# Patient Record
Sex: Female | Born: 1955
Health system: Southern US, Community
[De-identification: ages and names within clinical notes are randomized; demographics above are authoritative.]

## PROBLEM LIST (undated history)

## (undated) DIAGNOSIS — I10 Essential (primary) hypertension: Secondary | ICD-10-CM

## (undated) DIAGNOSIS — E039 Hypothyroidism, unspecified: Secondary | ICD-10-CM

## (undated) DIAGNOSIS — E785 Hyperlipidemia, unspecified: Secondary | ICD-10-CM

## (undated) HISTORY — DX: Hypothyroidism, unspecified: E03.9

## (undated) HISTORY — DX: Hyperlipidemia, unspecified: E78.5

## (undated) HISTORY — DX: Essential (primary) hypertension: I10

---

## 2006-08-07 ENCOUNTER — Observation Stay (HOSPITAL_COMMUNITY): Admission: EM | Admit: 2006-08-07 | Discharge: 2006-08-08 | Payer: Self-pay | Admitting: Emergency Medicine

## 2006-08-07 ENCOUNTER — Ambulatory Visit: Payer: Self-pay | Admitting: Cardiology

## 2006-12-28 ENCOUNTER — Ambulatory Visit (HOSPITAL_COMMUNITY): Admission: RE | Admit: 2006-12-28 | Discharge: 2006-12-28 | Payer: Self-pay | Admitting: Cardiology

## 2007-04-19 ENCOUNTER — Other Ambulatory Visit: Admission: RE | Admit: 2007-04-19 | Discharge: 2007-04-19 | Payer: Self-pay | Admitting: Family Medicine

## 2008-01-25 ENCOUNTER — Ambulatory Visit (HOSPITAL_COMMUNITY): Admission: RE | Admit: 2008-01-25 | Discharge: 2008-01-25 | Payer: Self-pay | Admitting: Family Medicine

## 2008-04-25 ENCOUNTER — Other Ambulatory Visit: Admission: RE | Admit: 2008-04-25 | Discharge: 2008-04-25 | Payer: Self-pay | Admitting: Family Medicine

## 2009-02-09 ENCOUNTER — Ambulatory Visit (HOSPITAL_COMMUNITY): Admission: RE | Admit: 2009-02-09 | Discharge: 2009-02-09 | Payer: Self-pay | Admitting: Family Medicine

## 2009-05-22 ENCOUNTER — Other Ambulatory Visit: Admission: RE | Admit: 2009-05-22 | Discharge: 2009-05-22 | Payer: Self-pay | Admitting: Family Medicine

## 2010-02-25 ENCOUNTER — Ambulatory Visit (HOSPITAL_COMMUNITY)
Admission: RE | Admit: 2010-02-25 | Discharge: 2010-02-25 | Payer: Self-pay | Source: Home / Self Care | Admitting: Family Medicine

## 2010-06-28 ENCOUNTER — Other Ambulatory Visit: Payer: Self-pay | Admitting: Family Medicine

## 2010-06-28 ENCOUNTER — Other Ambulatory Visit (HOSPITAL_COMMUNITY)
Admission: RE | Admit: 2010-06-28 | Discharge: 2010-06-28 | Disposition: A | Discharge status: Home / Self Care | Payer: 59 | Source: Ambulatory Visit | Attending: Family Medicine | Admitting: Family Medicine

## 2010-06-28 DIAGNOSIS — Z124 Encounter for screening for malignant neoplasm of cervix: Secondary | ICD-10-CM | POA: Insufficient documentation

## 2010-08-10 NOTE — Discharge Summary (Signed)
NAME:  Sarah Farrell, Sarah Farrell               ACCOUNT NO.:  [id number]   MEDICAL RECORD NO.:  [id number]          PATIENT TYPE:  INP   LOCATION:  6527                         FACILITY:  [facility]   PHYSICIAN:  Thomas C. Wall, MD, FACCDATE OF BIRTH:  [birth year]   DATE OF ADMISSION:  08/07/2006  DATE OF DISCHARGE:  08/08/2006                               DISCHARGE SUMMARY   PRIMARY CARDIOLOGIST:  Dr. [place] Bing.   PRIMARY CARE PHYSICIAN:  [facility] [address] on [employer].   PROCEDURES PERFORMED DURING HOSPITALIZATION:  Stress Myoview dated Aug 08, 2006.  A.  Nuclear scintigraphy results revealed no evidence of ischemia with  normal ejection fraction per [facility].   PRIMARY DIAGNOSES:  1. Atypical chest pain.  2. Probable hypothyroidism with abnormal TSH of 21.876.  3. Hypercholesterolemia.   HISTORY OF PRESENT ILLNESS:  This is a 55 year old Caucasian female with  continued complaints of left shoulder and neck pain anteriorly and  posteriorly which she describes as heaviness and numbness with  radiculopathy to the left arm since last evening prior to admission.  The patient states she went to bed feeling some pressure and tingling in  her left arm and left shoulder and once she awoke the following morning,  she again felt these symptoms.  She works as a Advertising copywriter and works all  day, but this pressure and pain in her left shoulder, to the left side  of her back and numbness and tingling in her left arm was persistent and  after work she went to [employer] office for workup.  There  the patient was given an EKG which was showing normal sinus rhythm and  she was given one sublingual nitroglycerin  and aspirin and referred to  the emergency room for further evaluation to rule out myocardial  infarction or cardiac etiology for chest discomfort.   The patient was seen and examined by myself and Dr. Vernice Jefferson,  cardiology fellow,  with Dr. [place] Bing.  The patient was admitted,  cardiac enzymes were cycled and found to be negative.  The patient was  scheduled for nuclear medicine stress Myoview study the following  morning, she was placed on Protonix, aspirin and lipids and LFTs were  drawn the following morning.   The patient was found to have an elevated cholesterol of 229,  triglycerides of 173, HDL of 45 with an LDL of 149.  The patient was  started on Lipitor 20 mg p.o. q.h.s.  Followup TSH was found to be  elevated at 21.876, T3 and T4 had been ordered, the results are pending  at time of discharge dictation.  Cardiac markers were found to be  negative with troponins at 0.05, 0.05 and 0.05, respectively.  The  patient's chemistries and hemoglobin and hematocrit were also found to  be normal.   Patient did undergo stress Myoview study on the morning of Jul 29, 2006  which was read to be normal with no evidence of ischemia with a normal  ejection fraction.  After evaluation of this test and subsequent relief  of symptoms at this time, the patient was found to be stable for  discharge.  The patient will follow up with the [facility]  group secondary to the abnormal TSH.  I did call and speak with the  [facility] RN there today prior to discharge to discuss  the TSH result and need for followup as an outpatient, also notifying  them that T3 and T4 results are pending.  The patient has been advised  to follow up with [facility] group on her own accord for  continuation of care and continued workup concerning the abnormality of  the TSH and intervention as is found necessary by her primary care  physician.  The patient will be discharged today with no further cardiac  workup at this time.   DISCHARGE LABS:  Hemoglobin 12.5, hematocrit 37.2, white blood cells  4.5, platelets 138, sodium 137, potassium 4.9, chloride 105, CO2 23, BUN  15, creatinine 0.8, glucose 118.   Troponin 0.05, 0.05 and 0.05,  respectively.  Cholesterol 229, triglycerides 173, HDL 45, LDL 149, TSH  21.876 with free T3 and T4 pending.   EKG revealing normal sinus rhythm without evidence of ischemia seen.   VITAL SIGNS ON DISCHARGE:  Blood pressure 143/87, heart rate 77,  respirations 18, temperature 98.4.   DISCHARGE MEDICATIONS:  1. Enteric-coated aspirin 81 mg one p.o. daily.  2. Lipitor 20 mg one p.o. q.h.s.   ALLERGIES:  No known drug allergies.   FOLLOWUP PLANS AND APPOINTMENTS:  1. The patient will follow up with physicians at [facility] of      [facility] on [facility] for continuation of medical treatment      and evaluation for abnormal TSH drawn at [facility].  2. The patient has been advised need to take Lipitor 20 mg p.o. q.h.s.      with followup labs per primary care physician in six weeks for      lipids and LFTs.  3. The patient has been given prescriptions for Lipitor and enteric-      coated aspirin.   We would be happy to see the patient on an outpatient basis for any  continuation of cardiac care, however at this time, no further cardiac  workup is found to be necessary.   Time spent with the patient to include physician time:  40 minutes.      Bettey Mare. Lyman Bishop, NP      Jesse Sans. Daleen Squibb, MD, [facility]  Electronically Signed    KML/MEDQ  D:  08/08/2006  T:  08/08/2006  Job:  [id number]   cc:   [facility]

## 2010-08-10 NOTE — H&P (Signed)
NAME:  Sarah Farrell, Sarah Farrell               ACCOUNT NO.:  [id number]   MEDICAL RECORD NO.:  [id number]          PATIENT TYPE:  EMS   LOCATION:  MAJO                         FACILITY:  [facility]   PHYSICIAN:  Bettey Mare. [place], NPDATE OF BIRTH:  03-05-1956   DATE OF ADMISSION:  08/07/2006  DATE OF DISCHARGE:                              HISTORY & PHYSICAL   PRIMARY CARE PHYSICIAN:  [facility] [place].   PRIMARY CARDIOLOGIST:  She will be new and will be Dr. [place] Bing.   HISTORY OF PRESENT ILLNESS:  This is a 55 year old Caucasian female with  complaint of left shoulder and left neck pain anteriorly and posteriorly  which she describes as more of a heaviness with numbness and tingling  and radiculopathy to the left arm since last night.  The patient states  that the sensation has been there constantly.  It does not wax or wane.  The patient has no associated shortness of breath, dizziness, nausea,  vomiting or diaphoresis.  The patient states that she has been having  this type of pain on and off about once a week over the last two months.  The patient states that the duration is usually for a few hours.  She  takes aspirin and it goes away.  Today, the patient states that she woke  up with the same kind of discomfort in her left shoulder, left neck and  back with radiculopathy this a.m. that she went to bed with the night  before.  She worked all day as a Advertising copywriter and when the pain did not  go away, the patient presented to [facility] with these complaints.  The  patient was seen at [facility].  EKG was completed showing normal sinus  rhythm. She was given 1 sublingual nitroglycerin and aspirin and  referred to the emergency room.  The patient has been seen and examined  by myself and Dr. Vernice Jefferson, cardiology fellow in the ER.   REVIEW OF SYSTEMS:  As above, otherwise negative.   PAST MEDICAL HISTORY:  Negative for diabetes, hypertension,  hypercholesterolemia or  any prior cardiac history.   PAST SURGICAL HISTORY:  None.   SOCIAL HISTORY:  The patient lives in [place] with her husband.  She  is a Financial trader.  She has two children.  Negative for history of  tobacco.  No ETOH use.   FAMILY HISTORY:  Mother with diabetes.  Father died of an MI at age 83.  She has sisters with diabetes.   CURRENT MEDICATIONS:  At home aspirin p.r.n.   ALLERGIES:  NO KNOWN DRUG ALLERGIES.   CURRENT LABS:  Pending.   POINT OF CARE:  Pending.   PHYSICAL EXAMINATION:  CURRENT VITAL SIGNS:  Blood pressure 132/78.  Pulse 98.  Respirations 20.  Temperature 97.6.  O2 sat 100% on 4 liters.  HEENT:  Head is normocephalic, atraumatic.  Eyes: PERRLA.  Mucous  membranes in mouth are pink and moist.  Tongue is midline.  NECK:  Supple.  There is no JVD.  There are no carotid bruits  appreciated.  CARDIOVASCULAR:  Regular rate and rhythm without murmurs, rubs or  gallops.  Pulses are 2+ and equal.  There are no carotid bruits  appreciated.  LUNGS:  Clear to auscultation without wheezes, rales or rhonchi.  There  is no pain with inspiration.  ABDOMEN:  Soft and nontender with 2+ bowel sounds.  There is no  organomegaly palpated.  EXTREMITIES: There is no clubbing, cyanosis, edema or weakness noted.  MUSCULOSKELETAL:  There is no joint deformity.  Cervical palpations do  not reveal any abnormalities.  There were no spinal abnormalities or CVA  tenderness.  NEURO:  Intact.   EKG reveals normal sinus rhythm with ventricular rate of 87 beats per  minute.   IMPRESSION:  1. Atypical chest pain.  2. Rule out acute coronary syndrome.   PLAN:  The patient will be admitted over night to rule out myocardial  infarction.  The patient will be held NPO after midnight and scheduled  for a Cardiolite stress test in the a.m.  In the interim the patient  will be started on enteric coated aspirin 81 mg one p.o. daily, Protonix  40 mg p.o. q day.  The patient will be treated  with Percocet p.r.n. for  chest discomfort.  This appears to be more musculoskeletal in etiology,  however, she does have a family history with no other cardiovascular  risk factors.  The patient will also have lipids and LFTs scheduled in  the morning for risk ratification and we will make further  recommendations throughout the hospital course with changes depending  upon patient's symptoms and response to treatment.      Bettey Mare. Lyman Bishop, NP     KML/MEDQ  D:  08/07/2006  T:  08/07/2006  Job:  [id number]   cc:   [facility]

## 2011-04-18 ENCOUNTER — Other Ambulatory Visit (HOSPITAL_COMMUNITY): Payer: Self-pay | Admitting: Family Medicine

## 2011-04-18 DIAGNOSIS — Z1231 Encounter for screening mammogram for malignant neoplasm of breast: Secondary | ICD-10-CM

## 2011-04-19 ENCOUNTER — Ambulatory Visit (HOSPITAL_COMMUNITY)
Admission: RE | Admit: 2011-04-19 | Discharge: 2011-04-19 | Disposition: A | Discharge status: Home / Self Care | Payer: 59 | Source: Ambulatory Visit | Attending: Family Medicine | Admitting: Family Medicine

## 2011-04-19 DIAGNOSIS — Z1231 Encounter for screening mammogram for malignant neoplasm of breast: Secondary | ICD-10-CM

## 2011-04-22 ENCOUNTER — Other Ambulatory Visit: Payer: Self-pay | Admitting: Family Medicine

## 2011-04-22 DIAGNOSIS — R928 Other abnormal and inconclusive findings on diagnostic imaging of breast: Secondary | ICD-10-CM

## 2011-05-02 ENCOUNTER — Ambulatory Visit
Admission: RE | Admit: 2011-05-02 | Discharge: 2011-05-02 | Disposition: A | Discharge status: Home / Self Care | Payer: 59 | Source: Ambulatory Visit | Attending: Family Medicine | Admitting: Family Medicine

## 2011-05-02 DIAGNOSIS — R928 Other abnormal and inconclusive findings on diagnostic imaging of breast: Secondary | ICD-10-CM

## 2011-10-25 ENCOUNTER — Other Ambulatory Visit: Payer: Self-pay | Admitting: Family Medicine

## 2011-10-25 DIAGNOSIS — D249 Benign neoplasm of unspecified breast: Secondary | ICD-10-CM

## 2011-10-28 ENCOUNTER — Ambulatory Visit
Admission: RE | Admit: 2011-10-28 | Discharge: 2011-10-28 | Disposition: A | Discharge status: Home / Self Care | Payer: 59 | Source: Ambulatory Visit | Attending: Family Medicine | Admitting: Family Medicine

## 2011-10-28 DIAGNOSIS — D249 Benign neoplasm of unspecified breast: Secondary | ICD-10-CM

## 2012-06-26 ENCOUNTER — Other Ambulatory Visit: Payer: Self-pay | Admitting: Family Medicine

## 2012-06-26 DIAGNOSIS — N63 Unspecified lump in unspecified breast: Secondary | ICD-10-CM

## 2012-07-06 ENCOUNTER — Ambulatory Visit
Admission: RE | Admit: 2012-07-06 | Discharge: 2012-07-06 | Disposition: A | Discharge status: Home / Self Care | Payer: 59 | Source: Ambulatory Visit | Attending: Family Medicine | Admitting: Family Medicine

## 2012-07-06 DIAGNOSIS — N63 Unspecified lump in unspecified breast: Secondary | ICD-10-CM

## 2013-08-05 ENCOUNTER — Other Ambulatory Visit (HOSPITAL_COMMUNITY): Payer: Self-pay | Admitting: Family Medicine

## 2013-08-05 DIAGNOSIS — Z1231 Encounter for screening mammogram for malignant neoplasm of breast: Secondary | ICD-10-CM

## 2013-08-07 ENCOUNTER — Ambulatory Visit (HOSPITAL_COMMUNITY)
Admission: RE | Admit: 2013-08-07 | Discharge: 2013-08-07 | Disposition: A | Discharge status: Home / Self Care | Payer: 59 | Source: Ambulatory Visit | Attending: Family Medicine | Admitting: Family Medicine

## 2013-08-07 DIAGNOSIS — Z1231 Encounter for screening mammogram for malignant neoplasm of breast: Secondary | ICD-10-CM

## 2013-09-02 ENCOUNTER — Other Ambulatory Visit (HOSPITAL_COMMUNITY)
Admission: RE | Admit: 2013-09-02 | Discharge: 2013-09-02 | Disposition: A | Discharge status: Home / Self Care | Payer: 59 | Source: Ambulatory Visit | Attending: Family Medicine | Admitting: Family Medicine

## 2013-09-02 ENCOUNTER — Other Ambulatory Visit: Payer: Self-pay | Admitting: Family Medicine

## 2013-09-02 DIAGNOSIS — Z124 Encounter for screening for malignant neoplasm of cervix: Secondary | ICD-10-CM | POA: Insufficient documentation

## 2013-09-03 LAB — CYTOLOGY - PAP

## 2014-09-12 ENCOUNTER — Other Ambulatory Visit (HOSPITAL_COMMUNITY): Payer: Self-pay | Admitting: Family Medicine

## 2014-09-12 DIAGNOSIS — Z1231 Encounter for screening mammogram for malignant neoplasm of breast: Secondary | ICD-10-CM

## 2014-09-17 ENCOUNTER — Ambulatory Visit (HOSPITAL_COMMUNITY)
Admission: RE | Admit: 2014-09-17 | Discharge: 2014-09-17 | Disposition: A | Discharge status: Home / Self Care | Payer: 59 | Source: Ambulatory Visit | Attending: Family Medicine | Admitting: Family Medicine

## 2014-09-17 DIAGNOSIS — Z1231 Encounter for screening mammogram for malignant neoplasm of breast: Secondary | ICD-10-CM | POA: Insufficient documentation

## 2014-09-22 ENCOUNTER — Other Ambulatory Visit: Payer: Self-pay | Admitting: Family Medicine

## 2014-09-22 DIAGNOSIS — R928 Other abnormal and inconclusive findings on diagnostic imaging of breast: Secondary | ICD-10-CM

## 2014-10-01 ENCOUNTER — Ambulatory Visit
Admission: RE | Admit: 2014-10-01 | Discharge: 2014-10-01 | Disposition: A | Discharge status: Home / Self Care | Payer: 59 | Source: Ambulatory Visit | Attending: Family Medicine | Admitting: Family Medicine

## 2014-10-01 DIAGNOSIS — R928 Other abnormal and inconclusive findings on diagnostic imaging of breast: Secondary | ICD-10-CM

## 2015-10-13 ENCOUNTER — Other Ambulatory Visit: Payer: Self-pay | Admitting: Family Medicine

## 2015-10-13 DIAGNOSIS — Z1231 Encounter for screening mammogram for malignant neoplasm of breast: Secondary | ICD-10-CM

## 2015-10-16 ENCOUNTER — Ambulatory Visit
Admission: RE | Admit: 2015-10-16 | Discharge: 2015-10-16 | Disposition: A | Discharge status: Home / Self Care | Payer: Managed Care, Other (non HMO) | Source: Ambulatory Visit | Attending: Family Medicine | Admitting: Family Medicine

## 2015-10-16 DIAGNOSIS — Z1231 Encounter for screening mammogram for malignant neoplasm of breast: Secondary | ICD-10-CM

## 2016-04-04 DIAGNOSIS — E7800 Pure hypercholesterolemia, unspecified: Secondary | ICD-10-CM | POA: Diagnosis not present

## 2016-04-04 DIAGNOSIS — Z7984 Long term (current) use of oral hypoglycemic drugs: Secondary | ICD-10-CM | POA: Diagnosis not present

## 2016-04-04 DIAGNOSIS — E039 Hypothyroidism, unspecified: Secondary | ICD-10-CM | POA: Diagnosis not present

## 2016-04-04 DIAGNOSIS — I10 Essential (primary) hypertension: Secondary | ICD-10-CM | POA: Diagnosis not present

## 2016-04-04 DIAGNOSIS — E119 Type 2 diabetes mellitus without complications: Secondary | ICD-10-CM | POA: Diagnosis not present

## 2016-06-03 DIAGNOSIS — E039 Hypothyroidism, unspecified: Secondary | ICD-10-CM | POA: Diagnosis not present

## 2016-10-05 ENCOUNTER — Other Ambulatory Visit: Payer: Self-pay | Admitting: Family Medicine

## 2016-10-05 ENCOUNTER — Other Ambulatory Visit (HOSPITAL_COMMUNITY)
Admission: RE | Admit: 2016-10-05 | Discharge: 2016-10-05 | Disposition: A | Discharge status: Home / Self Care | Payer: BLUE CROSS/BLUE SHIELD | Source: Ambulatory Visit | Attending: Family Medicine | Admitting: Family Medicine

## 2016-10-05 DIAGNOSIS — E039 Hypothyroidism, unspecified: Secondary | ICD-10-CM | POA: Diagnosis not present

## 2016-10-05 DIAGNOSIS — E7800 Pure hypercholesterolemia, unspecified: Secondary | ICD-10-CM | POA: Diagnosis not present

## 2016-10-05 DIAGNOSIS — Z01411 Encounter for gynecological examination (general) (routine) with abnormal findings: Secondary | ICD-10-CM | POA: Diagnosis not present

## 2016-10-05 DIAGNOSIS — I10 Essential (primary) hypertension: Secondary | ICD-10-CM | POA: Diagnosis not present

## 2016-10-05 DIAGNOSIS — E119 Type 2 diabetes mellitus without complications: Secondary | ICD-10-CM | POA: Diagnosis not present

## 2016-10-05 DIAGNOSIS — Z0000 Encounter for general adult medical examination without abnormal findings: Secondary | ICD-10-CM | POA: Diagnosis not present

## 2016-10-06 LAB — CYTOLOGY - PAP: DIAGNOSIS: NEGATIVE

## 2016-10-28 ENCOUNTER — Other Ambulatory Visit: Payer: Self-pay | Admitting: Family Medicine

## 2016-10-28 DIAGNOSIS — Z1231 Encounter for screening mammogram for malignant neoplasm of breast: Secondary | ICD-10-CM

## 2016-10-31 ENCOUNTER — Ambulatory Visit
Admission: RE | Admit: 2016-10-31 | Discharge: 2016-10-31 | Disposition: A | Discharge status: Home / Self Care | Payer: BLUE CROSS/BLUE SHIELD | Source: Ambulatory Visit | Attending: Family Medicine | Admitting: Family Medicine

## 2016-10-31 DIAGNOSIS — Z1231 Encounter for screening mammogram for malignant neoplasm of breast: Secondary | ICD-10-CM | POA: Diagnosis not present

## 2016-12-12 DIAGNOSIS — E7800 Pure hypercholesterolemia, unspecified: Secondary | ICD-10-CM | POA: Diagnosis not present

## 2017-03-28 DIAGNOSIS — E119 Type 2 diabetes mellitus without complications: Secondary | ICD-10-CM

## 2017-03-28 HISTORY — DX: Type 2 diabetes mellitus without complications: E11.9

## 2017-04-17 DIAGNOSIS — E119 Type 2 diabetes mellitus without complications: Secondary | ICD-10-CM | POA: Diagnosis not present

## 2017-04-17 DIAGNOSIS — F329 Major depressive disorder, single episode, unspecified: Secondary | ICD-10-CM | POA: Diagnosis not present

## 2017-04-17 DIAGNOSIS — I10 Essential (primary) hypertension: Secondary | ICD-10-CM | POA: Diagnosis not present

## 2017-04-17 DIAGNOSIS — E039 Hypothyroidism, unspecified: Secondary | ICD-10-CM | POA: Diagnosis not present

## 2017-04-17 DIAGNOSIS — E7800 Pure hypercholesterolemia, unspecified: Secondary | ICD-10-CM | POA: Diagnosis not present

## 2017-04-20 DIAGNOSIS — E119 Type 2 diabetes mellitus without complications: Secondary | ICD-10-CM | POA: Diagnosis not present

## 2017-07-18 DIAGNOSIS — E1165 Type 2 diabetes mellitus with hyperglycemia: Secondary | ICD-10-CM | POA: Diagnosis not present

## 2017-09-29 DIAGNOSIS — E119 Type 2 diabetes mellitus without complications: Secondary | ICD-10-CM | POA: Diagnosis not present

## 2017-09-29 DIAGNOSIS — H35373 Puckering of macula, bilateral: Secondary | ICD-10-CM | POA: Diagnosis not present

## 2017-10-25 DIAGNOSIS — Z1159 Encounter for screening for other viral diseases: Secondary | ICD-10-CM | POA: Diagnosis not present

## 2017-10-25 DIAGNOSIS — I10 Essential (primary) hypertension: Secondary | ICD-10-CM | POA: Diagnosis not present

## 2017-10-25 DIAGNOSIS — E1165 Type 2 diabetes mellitus with hyperglycemia: Secondary | ICD-10-CM | POA: Diagnosis not present

## 2017-10-25 DIAGNOSIS — Z0000 Encounter for general adult medical examination without abnormal findings: Secondary | ICD-10-CM | POA: Diagnosis not present

## 2017-10-25 DIAGNOSIS — E7800 Pure hypercholesterolemia, unspecified: Secondary | ICD-10-CM | POA: Diagnosis not present

## 2017-10-25 DIAGNOSIS — E039 Hypothyroidism, unspecified: Secondary | ICD-10-CM | POA: Diagnosis not present

## 2017-10-25 DIAGNOSIS — Z23 Encounter for immunization: Secondary | ICD-10-CM | POA: Diagnosis not present

## 2017-12-04 ENCOUNTER — Other Ambulatory Visit: Payer: Self-pay | Admitting: Family Medicine

## 2017-12-04 DIAGNOSIS — Z1231 Encounter for screening mammogram for malignant neoplasm of breast: Secondary | ICD-10-CM

## 2017-12-27 ENCOUNTER — Ambulatory Visit: Payer: BLUE CROSS/BLUE SHIELD

## 2017-12-27 ENCOUNTER — Ambulatory Visit
Admission: RE | Admit: 2017-12-27 | Discharge: 2017-12-27 | Disposition: A | Discharge status: Home / Self Care | Payer: BLUE CROSS/BLUE SHIELD | Source: Ambulatory Visit | Attending: Family Medicine | Admitting: Family Medicine

## 2017-12-27 DIAGNOSIS — Z1231 Encounter for screening mammogram for malignant neoplasm of breast: Secondary | ICD-10-CM

## 2017-12-29 DIAGNOSIS — E039 Hypothyroidism, unspecified: Secondary | ICD-10-CM | POA: Diagnosis not present

## 2018-01-29 DIAGNOSIS — I10 Essential (primary) hypertension: Secondary | ICD-10-CM | POA: Diagnosis not present

## 2018-01-29 DIAGNOSIS — E1165 Type 2 diabetes mellitus with hyperglycemia: Secondary | ICD-10-CM | POA: Diagnosis not present

## 2018-01-29 DIAGNOSIS — E039 Hypothyroidism, unspecified: Secondary | ICD-10-CM | POA: Diagnosis not present

## 2018-04-27 DIAGNOSIS — E1165 Type 2 diabetes mellitus with hyperglycemia: Secondary | ICD-10-CM | POA: Diagnosis not present

## 2018-04-27 DIAGNOSIS — E039 Hypothyroidism, unspecified: Secondary | ICD-10-CM | POA: Diagnosis not present

## 2018-04-27 DIAGNOSIS — I10 Essential (primary) hypertension: Secondary | ICD-10-CM | POA: Diagnosis not present

## 2018-04-27 DIAGNOSIS — Z23 Encounter for immunization: Secondary | ICD-10-CM | POA: Diagnosis not present

## 2018-06-26 DIAGNOSIS — Z23 Encounter for immunization: Secondary | ICD-10-CM | POA: Diagnosis not present

## 2018-07-04 DIAGNOSIS — E039 Hypothyroidism, unspecified: Secondary | ICD-10-CM | POA: Diagnosis not present

## 2019-01-01 DIAGNOSIS — E119 Type 2 diabetes mellitus without complications: Secondary | ICD-10-CM | POA: Diagnosis not present

## 2019-01-01 DIAGNOSIS — I10 Essential (primary) hypertension: Secondary | ICD-10-CM | POA: Diagnosis not present

## 2019-01-01 DIAGNOSIS — E039 Hypothyroidism, unspecified: Secondary | ICD-10-CM | POA: Diagnosis not present

## 2019-01-01 DIAGNOSIS — Z0000 Encounter for general adult medical examination without abnormal findings: Secondary | ICD-10-CM | POA: Diagnosis not present

## 2019-01-01 DIAGNOSIS — E7800 Pure hypercholesterolemia, unspecified: Secondary | ICD-10-CM | POA: Diagnosis not present

## 2019-03-04 ENCOUNTER — Other Ambulatory Visit: Payer: Self-pay | Admitting: Family Medicine

## 2019-03-04 DIAGNOSIS — Z1231 Encounter for screening mammogram for malignant neoplasm of breast: Secondary | ICD-10-CM

## 2019-04-24 ENCOUNTER — Ambulatory Visit
Admission: RE | Admit: 2019-04-24 | Discharge: 2019-04-24 | Disposition: A | Discharge status: Home / Self Care | Payer: BC Managed Care – PPO | Source: Ambulatory Visit | Attending: Family Medicine | Admitting: Family Medicine

## 2019-04-24 ENCOUNTER — Other Ambulatory Visit: Payer: Self-pay

## 2019-04-24 DIAGNOSIS — Z1231 Encounter for screening mammogram for malignant neoplasm of breast: Secondary | ICD-10-CM | POA: Diagnosis not present

## 2019-05-27 DIAGNOSIS — E039 Hypothyroidism, unspecified: Secondary | ICD-10-CM | POA: Diagnosis not present

## 2019-07-03 DIAGNOSIS — E039 Hypothyroidism, unspecified: Secondary | ICD-10-CM | POA: Diagnosis not present

## 2019-07-03 DIAGNOSIS — I10 Essential (primary) hypertension: Secondary | ICD-10-CM | POA: Diagnosis not present

## 2019-07-03 DIAGNOSIS — E7800 Pure hypercholesterolemia, unspecified: Secondary | ICD-10-CM | POA: Diagnosis not present

## 2019-07-03 DIAGNOSIS — E119 Type 2 diabetes mellitus without complications: Secondary | ICD-10-CM | POA: Diagnosis not present

## 2020-01-13 ENCOUNTER — Other Ambulatory Visit (HOSPITAL_COMMUNITY)
Admission: RE | Admit: 2020-01-13 | Discharge: 2020-01-13 | Disposition: A | Discharge status: Home / Self Care | Payer: BC Managed Care – PPO | Source: Ambulatory Visit | Attending: Family Medicine | Admitting: Family Medicine

## 2020-01-13 ENCOUNTER — Other Ambulatory Visit: Payer: Self-pay | Admitting: Family Medicine

## 2020-01-13 DIAGNOSIS — Z0000 Encounter for general adult medical examination without abnormal findings: Secondary | ICD-10-CM | POA: Diagnosis not present

## 2020-01-13 DIAGNOSIS — E039 Hypothyroidism, unspecified: Secondary | ICD-10-CM | POA: Diagnosis not present

## 2020-01-13 DIAGNOSIS — Z01411 Encounter for gynecological examination (general) (routine) with abnormal findings: Secondary | ICD-10-CM | POA: Insufficient documentation

## 2020-01-13 DIAGNOSIS — E119 Type 2 diabetes mellitus without complications: Secondary | ICD-10-CM | POA: Diagnosis not present

## 2020-01-13 DIAGNOSIS — E7800 Pure hypercholesterolemia, unspecified: Secondary | ICD-10-CM | POA: Diagnosis not present

## 2020-01-14 LAB — CYTOLOGY - PAP
Comment: NEGATIVE
Diagnosis: NEGATIVE
High risk HPV: NEGATIVE

## 2020-07-20 ENCOUNTER — Other Ambulatory Visit: Payer: Self-pay | Admitting: Family Medicine

## 2020-07-20 DIAGNOSIS — Z1231 Encounter for screening mammogram for malignant neoplasm of breast: Secondary | ICD-10-CM

## 2020-09-08 ENCOUNTER — Ambulatory Visit
Admission: RE | Admit: 2020-09-08 | Discharge: 2020-09-08 | Disposition: A | Discharge status: Home / Self Care | Payer: BC Managed Care – PPO | Source: Ambulatory Visit | Attending: Family Medicine | Admitting: Family Medicine

## 2020-09-08 ENCOUNTER — Other Ambulatory Visit: Payer: Self-pay

## 2020-09-08 DIAGNOSIS — Z1231 Encounter for screening mammogram for malignant neoplasm of breast: Secondary | ICD-10-CM

## 2021-03-31 DIAGNOSIS — E7800 Pure hypercholesterolemia, unspecified: Secondary | ICD-10-CM | POA: Diagnosis not present

## 2021-03-31 DIAGNOSIS — E039 Hypothyroidism, unspecified: Secondary | ICD-10-CM | POA: Diagnosis not present

## 2021-03-31 DIAGNOSIS — E119 Type 2 diabetes mellitus without complications: Secondary | ICD-10-CM | POA: Diagnosis not present

## 2021-05-28 DIAGNOSIS — E039 Hypothyroidism, unspecified: Secondary | ICD-10-CM | POA: Diagnosis not present

## 2021-10-01 ENCOUNTER — Other Ambulatory Visit: Payer: Self-pay | Admitting: Family Medicine

## 2021-10-01 DIAGNOSIS — Z1231 Encounter for screening mammogram for malignant neoplasm of breast: Secondary | ICD-10-CM

## 2021-10-19 ENCOUNTER — Ambulatory Visit
Admission: RE | Admit: 2021-10-19 | Discharge: 2021-10-19 | Disposition: A | Discharge status: Home / Self Care | Payer: BC Managed Care – PPO | Source: Ambulatory Visit | Attending: Family Medicine | Admitting: Family Medicine

## 2021-10-19 DIAGNOSIS — Z1231 Encounter for screening mammogram for malignant neoplasm of breast: Secondary | ICD-10-CM | POA: Diagnosis not present

## 2021-11-21 DIAGNOSIS — S52614A Nondisplaced fracture of right ulna styloid process, initial encounter for closed fracture: Secondary | ICD-10-CM | POA: Diagnosis not present

## 2021-11-21 DIAGNOSIS — S52501A Unspecified fracture of the lower end of right radius, initial encounter for closed fracture: Secondary | ICD-10-CM | POA: Diagnosis not present

## 2021-11-24 DIAGNOSIS — M25531 Pain in right wrist: Secondary | ICD-10-CM | POA: Diagnosis not present

## 2021-11-24 DIAGNOSIS — M85641 Other cyst of bone, right hand: Secondary | ICD-10-CM | POA: Diagnosis not present

## 2021-11-24 DIAGNOSIS — S52501A Unspecified fracture of the lower end of right radius, initial encounter for closed fracture: Secondary | ICD-10-CM | POA: Diagnosis not present

## 2021-12-06 DIAGNOSIS — S52501A Unspecified fracture of the lower end of right radius, initial encounter for closed fracture: Secondary | ICD-10-CM | POA: Diagnosis not present

## 2021-12-06 DIAGNOSIS — M85641 Other cyst of bone, right hand: Secondary | ICD-10-CM | POA: Diagnosis not present

## 2021-12-27 DIAGNOSIS — M85641 Other cyst of bone, right hand: Secondary | ICD-10-CM | POA: Diagnosis not present

## 2021-12-27 DIAGNOSIS — S52501A Unspecified fracture of the lower end of right radius, initial encounter for closed fracture: Secondary | ICD-10-CM | POA: Diagnosis not present

## 2022-01-24 DIAGNOSIS — S52501A Unspecified fracture of the lower end of right radius, initial encounter for closed fracture: Secondary | ICD-10-CM | POA: Diagnosis not present

## 2022-01-24 DIAGNOSIS — M85641 Other cyst of bone, right hand: Secondary | ICD-10-CM | POA: Diagnosis not present

## 2022-02-22 DIAGNOSIS — I10 Essential (primary) hypertension: Secondary | ICD-10-CM | POA: Diagnosis not present

## 2022-02-23 DIAGNOSIS — E039 Hypothyroidism, unspecified: Secondary | ICD-10-CM | POA: Diagnosis not present

## 2022-02-23 DIAGNOSIS — E119 Type 2 diabetes mellitus without complications: Secondary | ICD-10-CM | POA: Diagnosis not present

## 2022-02-23 DIAGNOSIS — I10 Essential (primary) hypertension: Secondary | ICD-10-CM | POA: Diagnosis not present

## 2022-03-03 DIAGNOSIS — F419 Anxiety disorder, unspecified: Secondary | ICD-10-CM | POA: Diagnosis not present

## 2022-03-03 DIAGNOSIS — I10 Essential (primary) hypertension: Secondary | ICD-10-CM | POA: Diagnosis not present

## 2022-03-08 DIAGNOSIS — I10 Essential (primary) hypertension: Secondary | ICD-10-CM | POA: Diagnosis not present

## 2022-03-24 ENCOUNTER — Ambulatory Visit: Payer: BC Managed Care – PPO | Attending: Cardiology | Admitting: Cardiology

## 2022-03-24 ENCOUNTER — Encounter: Payer: Self-pay | Admitting: Cardiology

## 2022-03-24 VITALS — BP 164/86 | HR 100 | Ht 65.0 in | Wt 160.4 lb

## 2022-03-24 DIAGNOSIS — Z8249 Family history of ischemic heart disease and other diseases of the circulatory system: Secondary | ICD-10-CM | POA: Diagnosis not present

## 2022-03-24 DIAGNOSIS — R000 Tachycardia, unspecified: Secondary | ICD-10-CM | POA: Diagnosis not present

## 2022-03-24 DIAGNOSIS — I10 Essential (primary) hypertension: Secondary | ICD-10-CM | POA: Diagnosis not present

## 2022-03-24 DIAGNOSIS — E1169 Type 2 diabetes mellitus with other specified complication: Secondary | ICD-10-CM | POA: Insufficient documentation

## 2022-03-24 DIAGNOSIS — E785 Hyperlipidemia, unspecified: Secondary | ICD-10-CM

## 2022-03-24 NOTE — Patient Instructions (Signed)
Medication Instructions:   No changes   Recommend you check blood pressure at least 1 week prior to next visit with Dr Lindell Noe -  take  the reading with you to the appointment. Recommendation for Dr Lindell Noe  start you on a  Beta Blocker  ( like  carvedilol ) - heart rate is a little fast.   *If you need a refill on your cardiac medications before your next appointment, please call your pharmacy*   Lab Work:  Not needed   Testing/Procedures:   Dr Ellyn Hack  recommends  Dr Lindell Noe order a Coronary Calcium Score Scan  -  Coronary CalciumScan A coronary calcium scan is an imaging test used to look for deposits of calcium and other fatty materials (plaques) in the inner lining of the blood vessels of the heart (coronary arteries). These deposits of calcium and plaques can partly clog and narrow the coronary arteries without producing any symptoms or warning signs. This puts a person at risk for a heart attack. This test can detect these deposits before symptoms develop.  Follow-Up: At [facility], you and your health needs are our priority.  As part of our continuing mission to provide you with exceptional heart care, we have created designated Provider Care Teams.  These Care Teams include your primary Cardiologist (physician) and Advanced Practice Providers (APPs -  Physician Assistants and Nurse Practitioners) who all work together to provide you with the care you need, when you need it.     Your next appointment:   As needed     The format for your next appointment:   In Person  Provider:   Glenetta Hew, MD    Other Instructions   Recommend you check blood pressure at least 1 week prior to next visit with Dr Lindell Noe -  take  the reading with you to the appointment. Recommendation for Dr Lindell Noe  start you on a  Beta Blocker  ( like  carvedilol ) - heart rate is a little fast.     Dr Ellyn Hack  recommends  Dr Lindell Noe order a Coronary Calcium Score Scan

## 2022-03-24 NOTE — Progress Notes (Signed)
Primary Care Provider: Glenis Smoker, MD; [facility] Cardiologist: Glenetta Hew, MD Electrophysiologist: None  Clinic Note: Chief Complaint  Patient presents with   Hypertension   New Patient (Initial Visit)    Referred because of high blood pressure and possible family history of CAD.  Patient request.  Her husband is a patient of mine (Dr. Ellyn Hack)    ===================================  ASSESSMENT/PLAN   Problem List Items Addressed This Visit       Cardiology Problems   Hyperlipidemia associated with type 2 diabetes mellitus ([place]) (Chronic)    Most recent lipids from January 2023 were not very well-controlled.  She says that they were checked at PCPs office and were better, however I do not have those labs.  She is now on a statin, and with based on risk of hypertension hyperlipidemia and diabetes, which she at least see an LDL less than 100.  Coronary Calcium Score to help [place] determine if the goal should be post more aggressively than that.  She is still thinking about Coronary Calcium Score evaluation. For now we will continue 10 mg Crestor weekly.  I think she probably would benefit from a higher or more frequent dosing interval, however not have the labs.  She is only on metformin and her most recent A1c was 6.8.  Target would be less than 6, will defer to PCP for management, but would consider the possibility of SGLT2 inhibitor or GLP-1 agonist in addition to metformin as they are both beneficial from a cardiac standpoint as well.      Relevant Medications   metFORMIN (GLUCOPHAGE-XR) 500 MG 24 hr tablet   rosuvastatin (CRESTOR) 10 MG tablet   valsartan-hydrochlorothiazide (DIOVAN-HCT) 160-25 MG tablet   Essential hypertension (Chronic)    BP is high today in the clinic, but was better this morning.  I suspect that she probably will need titration medications. => Defer to PCP.  Anticipate either further titration of  valsartan-HCTZ versus addition of a third agent.  Based on her having tachycardia, would probably consider carvedilol for blood pressure plus heart rate control.      Relevant Medications   rosuvastatin (CRESTOR) 10 MG tablet   valsartan-hydrochlorothiazide (DIOVAN-HCT) 160-25 MG tablet     Other   Sinus tachycardia by electrocardiogram    She says that her blood pressure is little higher than usual today and her heart rate is up higher because she is stressed out.  She said her home her pressures were 140 over 80s.  I suspect that she will probably need additional blood pressure control but was just put on a higher dose of Diovan HCTZ.  She is due for follow-up with PCP in January.  If she remains somewhat tachycardic, would consider beta-blocker such as carvedilol to assist with blood pressure control.  Otherwise would continue to titrate valsartan-HCTZ.  Again, will defer to PCP and will be happy to help out if necessary if requested.      Family history of premature coronary heart disease - Primary    She is concerned about the possibility that her father died at age 43 of a heart attack although there is no confirmation of this.  She does have high blood pressure that is difficult to control along with diabetes-type II, and hyperlipidemia.  She is not actively having any cardiac symptoms, therefore I can see no indication for stress test evaluation for screening, but I think the best option would be Coronary Calcium score  evaluation which can give [place] a baseline risk assessment.  This will tell us how aggressive we need to be with her lipid, blood pressure and glycemic control.  I spent about 5 to 10 minutes explaining to her the concept of coronary calcium score and what this would provide [place] as far as baseline risk assessment.  She equivocated and was not sure that she wanted to have anything done.  She wanted me to treat her anxiety, focusing more on her atypical symptoms that actually  the purpose of this visit.  I gave her the option of proceeding with Coronary Calcium Score, she will discuss this with her PCP when she sees her in January.  They can make a decision whether or not they would like to proceed with a Coronary Calcium Score in.  This can be ordered by the PCP and can be followed up by me if abnormal.  The main focus would be to determine how aggressive we need to be with management of her risk factors.  I did not order the study, but will leave it up to the discussion between patient and PCP.       ===================================  HPI:    Sarah Farrell is a 66 y.o. female with PMH of HTN, HLD & DM-2 is being seen today for the evaluation of High Blood Pressure and Family History of Early Death at the request of Glenis Smoker, *.  Sarah Farrell was just seen on 03/03/2022 by this past Sela Hilding, MD noting that she was having high blood pressure recordings at home as high as the 588F to 027X systolic.  She took 2 of her 20 mg benazepril tablets and her blood pressure went down to 168/89.  She talked about her father dying at age 39 of possible heart attack.  Nothing confirmed  Recent Hospitalizations: None  Reviewed  CV studies:    The following studies were reviewed today: (if available, images/films reviewed: From Epic Chart or Care Everywhere) None:  Interval History:   Sarah Farrell presents here for cardiology evaluation based on hypertension and possible family history of CAD.   She was noticing some left upper quadrant/left lateral lower chest wall discomfort off and on.  She says that since Thanksgiving blood pressure has been higher than usual but she has not really had any true symptoms.  At work she has no problems whatsoever, but at night she gets home she has a weird tingling sensation in left arm.   Not CP or SOB/DOE.  Since then - no issues when @ work - no fatigue, CP, SOB or anxiety - but @ home when resting,may note some  L arm tingling. -- Felt to be ? Anxiety - given PRN. She has had her medications adjusted and is now taking valsartan HCTZ 160-25 mg daily.  She says that she has had some low blood pressure readings of 96/80?  But usually has whitecoat hypertension.  She says her blood pressure this morning at home was 146/84 mmHg.  One of the reasons for the referral was that she said her father died at age 21.  He had been sick and was told by the doctors that he had had a flu like illness.  He got home from the hospital evaluation and within a couple days and died.  Noted he was sure what happened and how.  Where she grew up, the healthcare system was less robust and they never got a diagnosis.  Someone mentioned that maybe he  had had a heart attack.   CV Review of Symptoms (Summary) Cardiovascular ROS: no chest pain or dyspnea on exertion positive for - L arm tingling & L UQ pain for 2 days,off & on insomnia negative for - edema, irregular heartbeat, loss of consciousness, orthopnea, palpitations, paroxysmal nocturnal dyspnea, rapid heart rate, shortness of breath, or syncope / near syncope, TIA/amaurosis fugax, claudication.  REVIEWED OF SYSTEMS   Review of Systems  Constitutional:  Negative for malaise/fatigue and weight loss.  HENT:  Negative for congestion.   Respiratory:  Negative for shortness of breath.   Cardiovascular:  Negative for chest pain and leg swelling.  Gastrointestinal:  Positive for abdominal pain (Left upper quadrant). Negative for blood in stool and melena.  Genitourinary:  Negative for hematuria.  Musculoskeletal:  Negative for joint pain and myalgias.  Neurological:  Positive for tingling (Left arm tingling).  Psychiatric/Behavioral:  Negative for depression and memory loss. The patient is nervous/anxious and has insomnia.        Was given PRN Atarax that she thinks she may need more frequently.    I have reviewed and (if needed) personally updated the patient's problem list,  medications, allergies, past medical and surgical history, social and family history.   PAST MEDICAL HISTORY   Past Medical History:  Diagnosis Date   Diabetes mellitus without complication ([place]) 8338   A1c February 23, 2022: 6.8.  On metformin   Hyperlipidemia    Taking rosuvastatin 10 g once a week.  Has not had labs checked since starting   Hypertension    With whitecoat hypertension component.   Hypothyroidism     PAST SURGICAL HISTORY   History reviewed. No pertinent surgical history.   There is no immunization history on file for this patient.  MEDICATIONS/ALLERGIES   Current Meds  Medication Sig   hydrOXYzine (ATARAX) 10 MG tablet Take 10 mg by mouth 2 (two) times daily as needed.   levothyroxine (SYNTHROID) 112 MCG tablet Take 1 tablet by mouth daily before breakfast.   metFORMIN (GLUCOPHAGE-XR) 500 MG 24 hr tablet TAKE 1 TABLET BY MOUTH WITH MORNING MEAL AND 2 TABS WITH EVENING MEAL AS DIRECTED   rosuvastatin (CRESTOR) 10 MG tablet Take 1 tablet by mouth once a week.   valsartan-hydrochlorothiazide (DIOVAN-HCT) 160-25 MG tablet Take 1 tablet by mouth daily.    No Known Allergies  SOCIAL HISTORY/FAMILY HISTORY   Reviewed in Epic:   Social History   Social History Narrative   Married mother of 2 daughters   Passive smoke exposure-husband used to smoke.      1 cup of coffee or tea a day   Walks to 3 times a week.   Family History  Problem Relation Age of Onset   Other Father 31       Died at age 47 after what appears to be fluid. ? MI    OBJCTIVE -PE, EKG, labs   Wt Readings from Last 3 Encounters:  03/24/22 160 lb 6.4 oz (72.8 kg)    Physical Exam: BP (!) 164/86   Pulse 100   Ht '5\' 5"'$  (1.651 m)   Wt 160 lb 6.4 oz (72.8 kg)   SpO2 99%   BMI 26.69 kg/m  Physical Exam Vitals reviewed.  Constitutional:      General: She is not in acute distress.    Appearance: Normal appearance. She is normal weight. She is not ill-appearing or  toxic-appearing.  HENT:     Head: Normocephalic and atraumatic.  Neck:  Vascular: No carotid bruit.  Cardiovascular:     Rate and Rhythm: Regular rhythm. Tachycardia present.     Pulses: Normal pulses.     Heart sounds: Normal heart sounds. No murmur heard.    No friction rub. No gallop.  Pulmonary:     Effort: Pulmonary effort is normal. No respiratory distress.     Breath sounds: No wheezing, rhonchi or rales.  Chest:     Chest wall: Tenderness (Left lower quadrant/lower rib space) present.  Abdominal:     General: Bowel sounds are normal. There is no distension.     Palpations: Abdomen is soft. There is no mass.     Tenderness: There is no abdominal tenderness. There is no guarding.  Musculoskeletal:        General: No swelling. Normal range of motion.     Cervical back: Normal range of motion and neck supple.  Skin:    General: Skin is warm and dry.  Neurological:     General: No focal deficit present.     Mental Status: She is alert and oriented to person, place, and time.     Cranial Nerves: No cranial nerve deficit.     Gait: Gait normal.  Psychiatric:        Mood and Affect: Mood normal.        Behavior: Behavior normal.        Thought Content: Thought content normal.        Judgment: Judgment normal.      Adult ECG Report  Rate: 100 ;  Rhythm: sinus tachycardia and normal axis, intervals and durations.  (Computer reads cannot rule out anterior infarct, age-indeterminate.  This does not meet criteria) ;   Narrative Interpretation: Essentially normal sinus tachycardia  Recent Labs: Most recent labs are from  January 2023: TC 215, TG 106, HDL 64, LDL 133. 02/23/2022: A1c -6.8.   03/08/2022: Cr 1.05, K+ 4.4. No results found for: "CHOL", "HDL", "LDLCALC", "LDLDIRECT", "TRIG", "CHOLHDL" No results found for: "CREATININE", "BUN", "NA", "K", "CL", "CO2"     No data to display          No results found for: "HGBA1C" No results found for:  "TSH"  ================================================== I spent a total of 35 minutes with the patient spent in direct patient consultation.  Additional time spent with chart review  / charting (studies, outside notes, etc): 32 min Total Time: 67 min  Current medicines are reviewed at length with the patient today.  (+/- concerns) n/a  Notice: This dictation was prepared with Dragon dictation along with smart phrase technology. Any transcriptional errors that result from this process are unintentional and may not be corrected upon review.   Studies Ordered:  No orders of the defined types were placed in this encounter.  No orders of the defined types were placed in this encounter.   Patient Instructions / Medication Changes & Studies & Tests Ordered   Patient Instructions  Medication Instructions:   No changes   Recommend you check blood pressure at least 1 week prior to next visit with Dr Lindell Noe -  take  the reading with you to the appointment. Recommendation for Dr Lindell Noe  start you on a  Beta Blocker  ( like  carvedilol ) - heart rate is a little fast.   *If you need a refill on your cardiac medications before your next appointment, please call your pharmacy*   Lab Work:  Not needed   Testing/Procedures:   Dr Ellyn Hack  recommends  Dr Lindell Noe order a Coronary Calcium Score Scan  -  Coronary CalciumScan A coronary calcium scan is an imaging test used to look for deposits of calcium and other fatty materials (plaques) in the inner lining of the blood vessels of the heart (coronary arteries). These deposits of calcium and plaques can partly clog and narrow the coronary arteries without producing any symptoms or warning signs. This puts a person at risk for a heart attack. This test can detect these deposits before symptoms develop.  Follow-Up: At [facility], you and your health needs are our priority.  As part of our continuing mission to provide you with  exceptional heart care, we have created designated Provider Care Teams.  These Care Teams include your primary Cardiologist (physician) and Advanced Practice Providers (APPs -  Physician Assistants and Nurse Practitioners) who all work together to provide you with the care you need, when you need it.     Your next appointment:   As needed     The format for your next appointment:   In Person  Provider:   Glenetta Hew, MD    Other Instructions   Recommend you check blood pressure at least 1 week prior to next visit with Dr Lindell Noe -  take  the reading with you to the appointment. Recommendation for Dr Lindell Noe  start you on a  Beta Blocker  ( like  carvedilol ) - heart rate is a little fast.     Dr Ellyn Hack  recommends  Dr Lindell Noe order a Coronary Calcium Score Scan    Leonie Man, MD, MS Glenetta Hew, M.D., M.S. Interventional Cardiologist  [place]  Pager # [phone] Phone # [phone] [address] [place], [place] [address]   Thank you for choosing [facility] [place]!!

## 2022-03-25 ENCOUNTER — Encounter: Payer: Self-pay | Admitting: Cardiology

## 2022-03-25 DIAGNOSIS — Z8249 Family history of ischemic heart disease and other diseases of the circulatory system: Secondary | ICD-10-CM | POA: Insufficient documentation

## 2022-03-25 NOTE — Assessment & Plan Note (Signed)
She says that her blood pressure is little higher than usual today and her heart rate is up higher because she is stressed out.  She said her home her pressures were 140 over 80s.  I suspect that she will probably need additional blood pressure control but was just put on a higher dose of Diovan HCTZ.  She is due for follow-up with PCP in January.  If she remains somewhat tachycardic, would consider beta-blocker such as carvedilol to assist with blood pressure control.  Otherwise would continue to titrate valsartan-HCTZ.  Again, will defer to PCP and will be happy to help out if necessary if requested.

## 2022-03-25 NOTE — Assessment & Plan Note (Signed)
She is concerned about the possibility that her father died at age 66 of a heart attack although there is no confirmation of this.  She does have high blood pressure that is difficult to control along with diabetes-type II, and hyperlipidemia.  She is not actively having any cardiac symptoms, therefore I can see no indication for stress test evaluation for screening, but I think the best option would be Coronary Calcium score evaluation which can give [place] a baseline risk assessment.  This will tell us how aggressive we need to be with her lipid, blood pressure and glycemic control.  I spent about 5 to 10 minutes explaining to her the concept of coronary calcium score and what this would provide [place] as far as baseline risk assessment.  She equivocated and was not sure that she wanted to have anything done.  She wanted me to treat her anxiety, focusing more on her atypical symptoms that actually the purpose of this visit.  I gave her the option of proceeding with Coronary Calcium Score, she will discuss this with her PCP when she sees her in January.  They can make a decision whether or not they would like to proceed with a Coronary Calcium Score in.  This can be ordered by the PCP and can be followed up by me if abnormal.  The main focus would be to determine how aggressive we need to be with management of her risk factors.  I did not order the study, but will leave it up to the discussion between patient and PCP.

## 2022-03-25 NOTE — Assessment & Plan Note (Addendum)
Most recent lipids from January 2023 were not very well-controlled.  She says that they were checked at PCPs office and were better, however I do not have those labs.  She is now on a statin, and with based on risk of hypertension hyperlipidemia and diabetes, which she at least see an LDL less than 100.  Coronary Calcium Score to help [place] determine if the goal should be post more aggressively than that.  She is still thinking about Coronary Calcium Score evaluation. For now we will continue 10 mg Crestor weekly.  I think she probably would benefit from a higher or more frequent dosing interval, however not have the labs.  She is only on metformin and her most recent A1c was 6.8.  Target would be less than 6, will defer to PCP for management, but would consider the possibility of SGLT2 inhibitor or GLP-1 agonist in addition to metformin as they are both beneficial from a cardiac standpoint as well.

## 2022-03-25 NOTE — Assessment & Plan Note (Signed)
BP is high today in the clinic, but was better this morning.  I suspect that she probably will need titration medications. => Defer to PCP.  Anticipate either further titration of valsartan-HCTZ versus addition of a third agent.  Based on her having tachycardia, would probably consider carvedilol for blood pressure plus heart rate control.

## 2022-04-13 DIAGNOSIS — Z0000 Encounter for general adult medical examination without abnormal findings: Secondary | ICD-10-CM | POA: Diagnosis not present

## 2022-04-13 DIAGNOSIS — E7800 Pure hypercholesterolemia, unspecified: Secondary | ICD-10-CM | POA: Diagnosis not present

## 2022-04-13 DIAGNOSIS — E119 Type 2 diabetes mellitus without complications: Secondary | ICD-10-CM | POA: Diagnosis not present

## 2022-04-14 ENCOUNTER — Other Ambulatory Visit (HOSPITAL_BASED_OUTPATIENT_CLINIC_OR_DEPARTMENT_OTHER): Payer: Self-pay | Admitting: Family Medicine

## 2022-04-14 DIAGNOSIS — Z8489 Family history of other specified conditions: Secondary | ICD-10-CM

## 2022-05-11 ENCOUNTER — Ambulatory Visit (HOSPITAL_BASED_OUTPATIENT_CLINIC_OR_DEPARTMENT_OTHER)
Admission: RE | Admit: 2022-05-11 | Discharge: 2022-05-11 | Disposition: A | Discharge status: Home / Self Care | Payer: BC Managed Care – PPO | Source: Ambulatory Visit | Attending: Family Medicine | Admitting: Family Medicine

## 2022-05-11 DIAGNOSIS — Z8489 Family history of other specified conditions: Secondary | ICD-10-CM | POA: Insufficient documentation

## 2022-05-18 ENCOUNTER — Telehealth: Payer: Self-pay | Admitting: Cardiology

## 2022-05-18 DIAGNOSIS — I10 Essential (primary) hypertension: Secondary | ICD-10-CM | POA: Diagnosis not present

## 2022-05-18 DIAGNOSIS — F411 Generalized anxiety disorder: Secondary | ICD-10-CM | POA: Diagnosis not present

## 2022-05-18 DIAGNOSIS — E7800 Pure hypercholesterolemia, unspecified: Secondary | ICD-10-CM | POA: Diagnosis not present

## 2022-05-18 NOTE — Telephone Encounter (Signed)
Caller reports patient had CAC Scoring test and Dr. Lindell Noe would like Dr. Ellyn Hack to review results.  Caller stated they will be faxing patient's OV from today (2/21) and labs from 1/17.  Caller stated can follow-up with patient directly.

## 2022-05-18 NOTE — Telephone Encounter (Signed)
Will route to MD/RN. Notes and calcium score in epic.  Thanks!

## 2022-05-19 NOTE — Telephone Encounter (Signed)
Coronary Calcium Score 382 with even distribution of calcium throughout the 3 major arteries is indicative of the presence of coronary artery disease which we somewhat expected.  With this usually means that we would recommend aggressive risk factor modification with cholesterol and blood pressure treatment.  Smoking cessation counseling if indicated.  Blood sugar control if indicated.  Healthy lifestyle with improved diet and increased exercise.  If there are concerning symptoms of exertional chest pain or pressure, shortness of breath with rest or exertion etc., we could then consider more detailed ischemic evaluation.  Glenetta Hew, MD

## 2022-05-23 NOTE — Telephone Encounter (Signed)
Called patient, advised of results below.   Patient is not currently having any symptoms to report. She will let us know. Previous AVS stated as needed follow up, she will let us know if there is any issues.

## 2022-07-13 DIAGNOSIS — F329 Major depressive disorder, single episode, unspecified: Secondary | ICD-10-CM | POA: Diagnosis not present

## 2022-07-13 DIAGNOSIS — E119 Type 2 diabetes mellitus without complications: Secondary | ICD-10-CM | POA: Diagnosis not present

## 2022-07-13 DIAGNOSIS — E7800 Pure hypercholesterolemia, unspecified: Secondary | ICD-10-CM | POA: Diagnosis not present

## 2022-07-13 DIAGNOSIS — I10 Essential (primary) hypertension: Secondary | ICD-10-CM | POA: Diagnosis not present

## 2022-10-17 DIAGNOSIS — E7800 Pure hypercholesterolemia, unspecified: Secondary | ICD-10-CM | POA: Diagnosis not present

## 2022-10-17 DIAGNOSIS — I10 Essential (primary) hypertension: Secondary | ICD-10-CM | POA: Diagnosis not present

## 2022-11-10 ENCOUNTER — Other Ambulatory Visit (HOSPITAL_BASED_OUTPATIENT_CLINIC_OR_DEPARTMENT_OTHER): Payer: Self-pay | Admitting: Family Medicine

## 2022-11-10 DIAGNOSIS — Z1231 Encounter for screening mammogram for malignant neoplasm of breast: Secondary | ICD-10-CM

## 2022-11-24 ENCOUNTER — Ambulatory Visit (HOSPITAL_BASED_OUTPATIENT_CLINIC_OR_DEPARTMENT_OTHER)
Admission: RE | Admit: 2022-11-24 | Discharge: 2022-11-24 | Disposition: A | Discharge status: Home / Self Care | Payer: BC Managed Care – PPO | Source: Ambulatory Visit | Attending: Family Medicine | Admitting: Family Medicine

## 2022-11-24 DIAGNOSIS — Z1231 Encounter for screening mammogram for malignant neoplasm of breast: Secondary | ICD-10-CM | POA: Insufficient documentation

## 2023-01-11 IMAGING — MG MM DIGITAL SCREENING BILAT W/ TOMO AND CAD
6 of 10 series · 6 of 30 positions shown · non-contrast
Comparison: Previous exam(s).

CLINICAL DATA: Screening.

EXAM:
DIGITAL SCREENING BILATERAL MAMMOGRAM WITH TOMOSYNTHESIS AND CAD
TECHNIQUE: Bilateral screening digital craniocaudal and mediolateral oblique
mammograms were obtained. Bilateral screening digital breast
tomosynthesis was performed. The images were evaluated with
computer-aided detection.

[L MLO synth-2D]
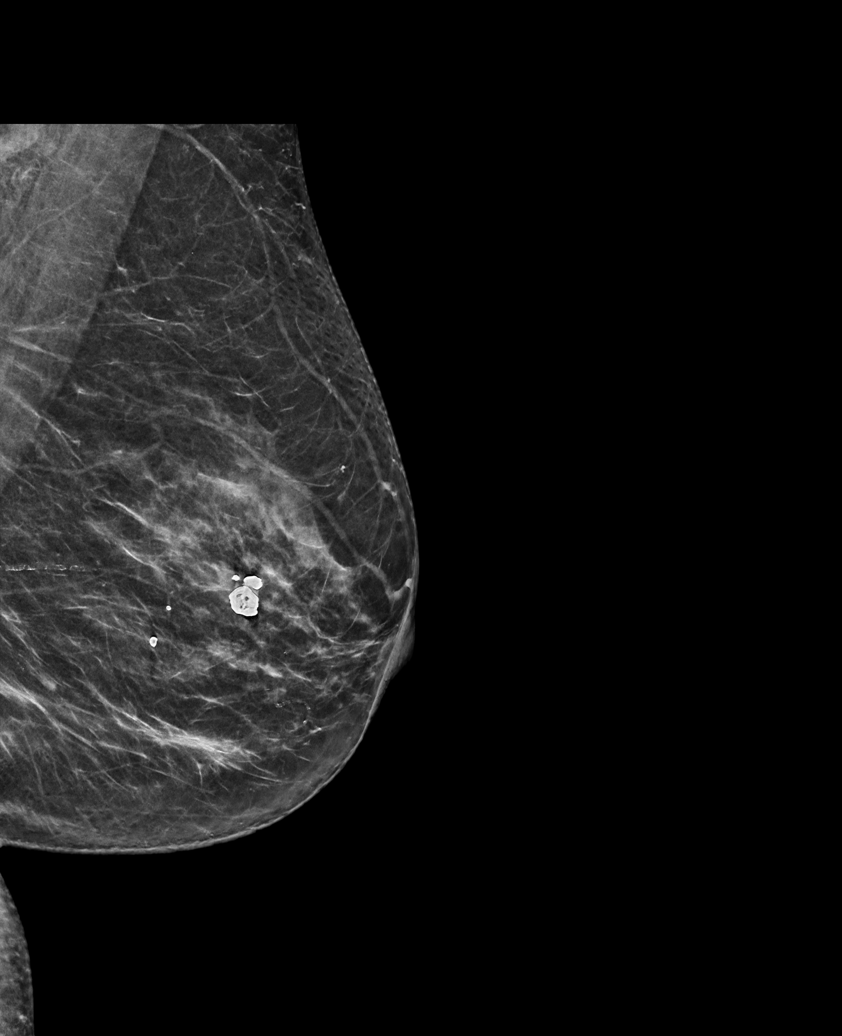

[R MLO synth-2D]
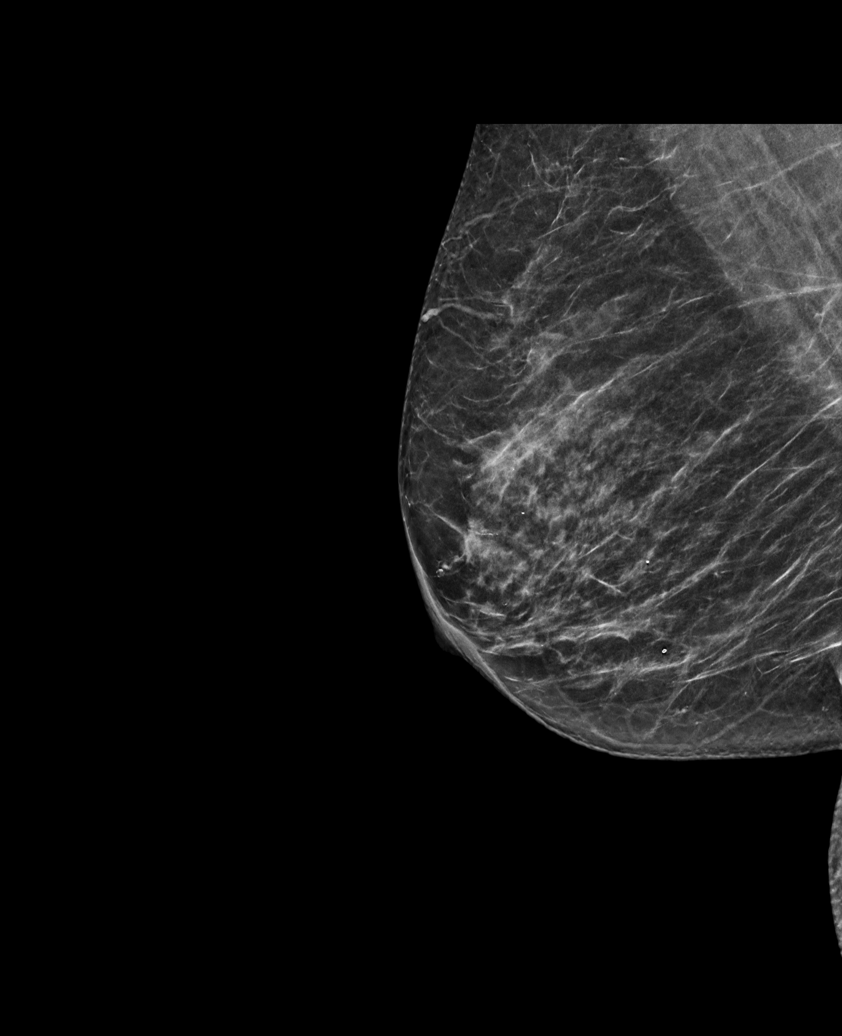

[L CC synth-2D (1 of 2)]
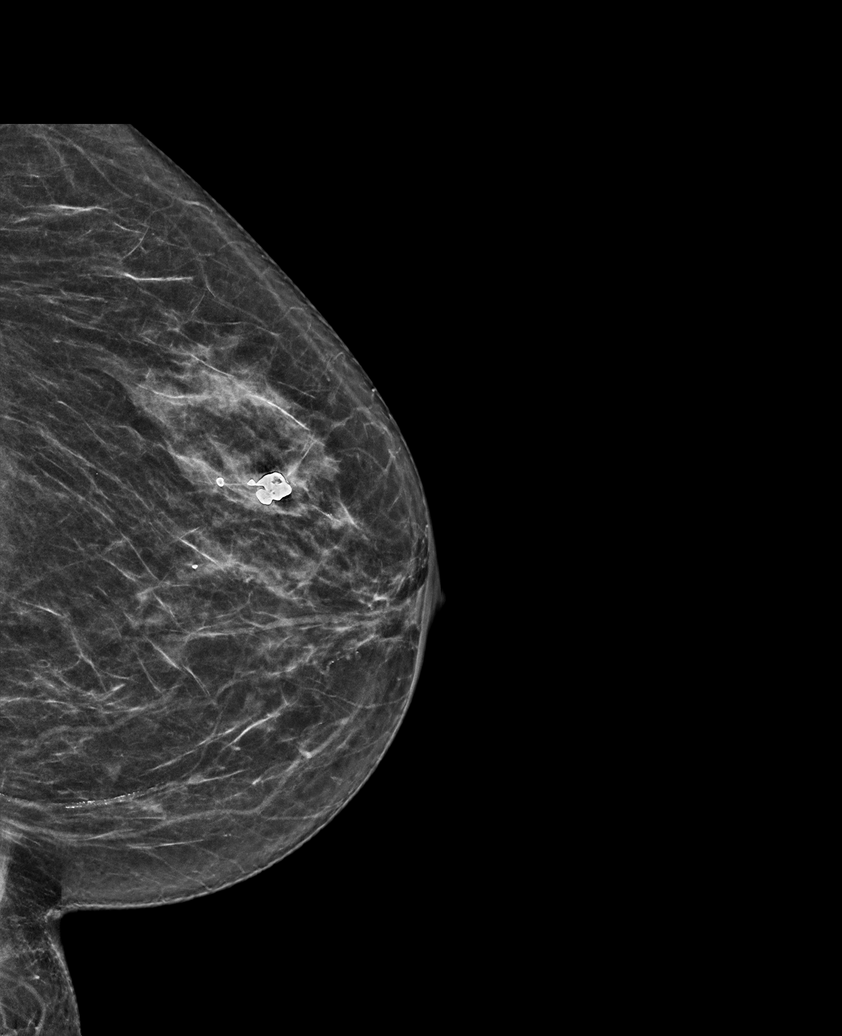

[R CC synth-2D]
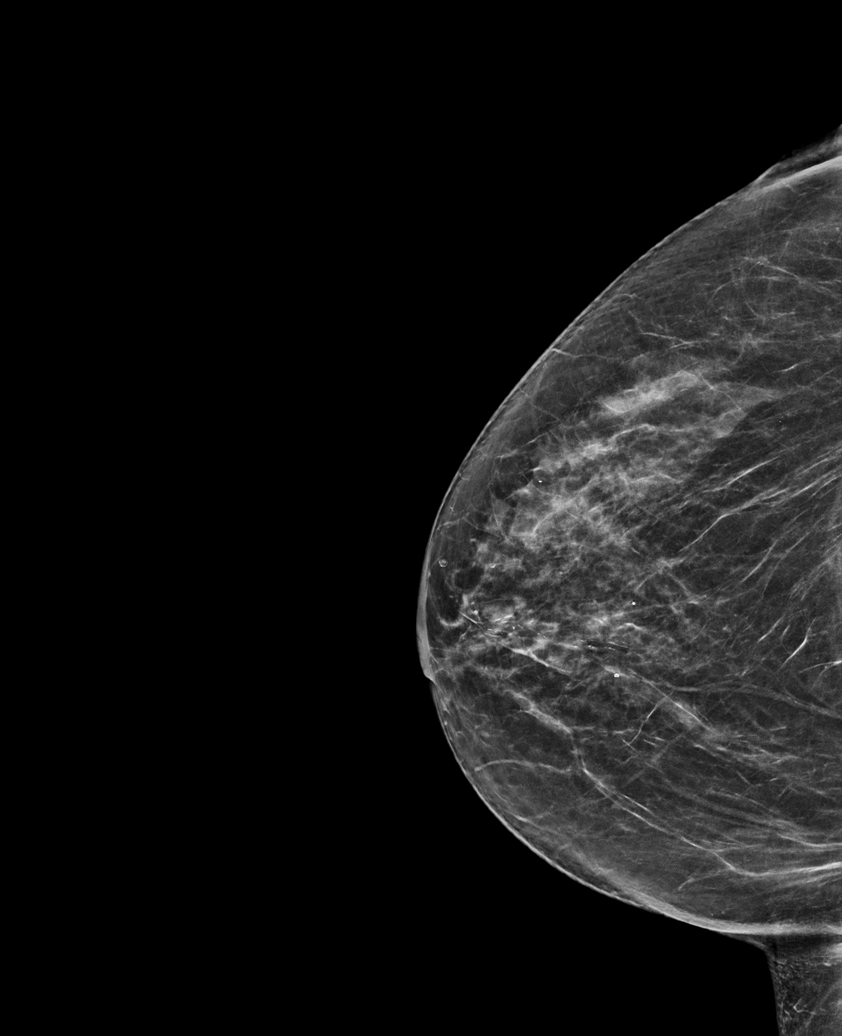

[L CC synth-2D (2 of 2)]
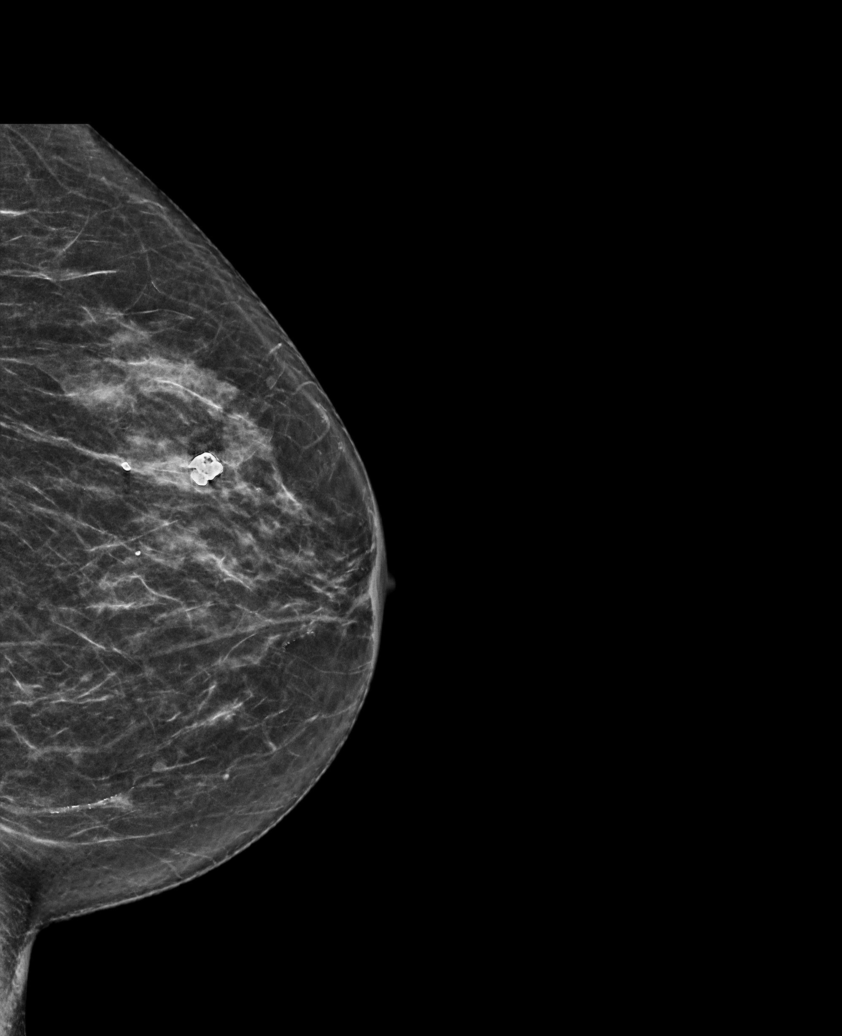

[L CC tomo · tomo slice 31/60.0]
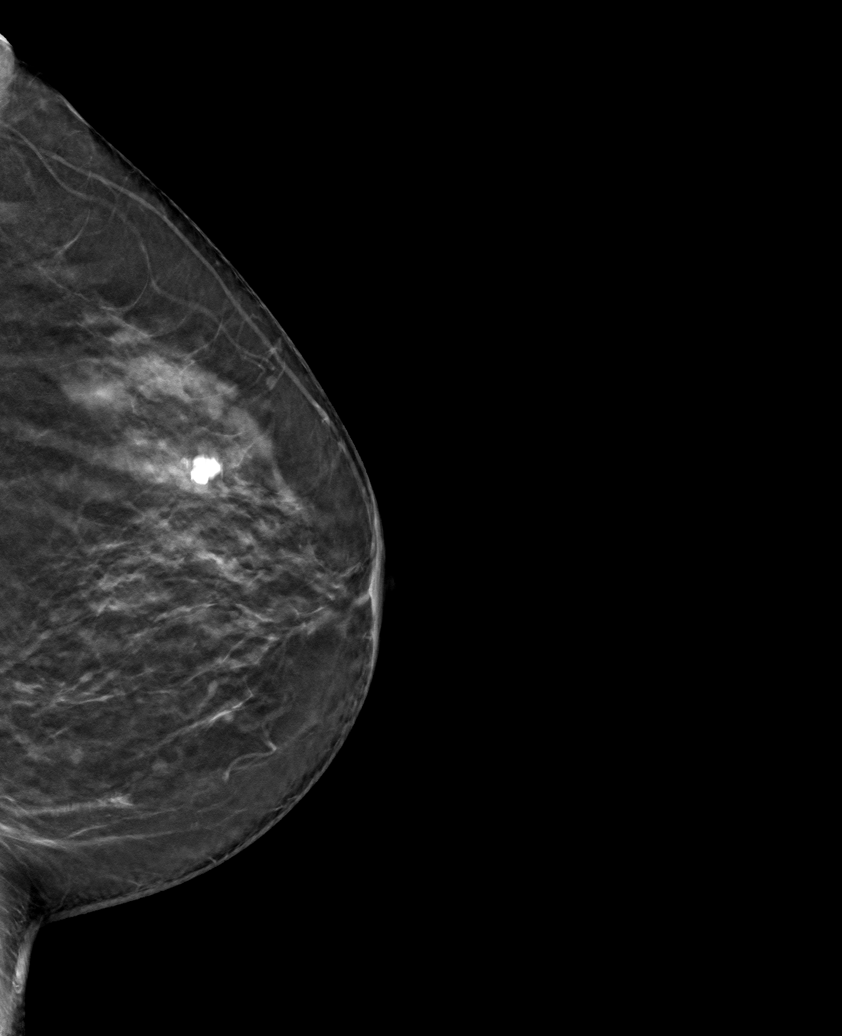

[6 of 30 positions shown; findings below may reference images not displayed]

ACR Breast Density Category b: There are scattered areas of
fibroglandular density.
FINDINGS: There are no findings suspicious for malignancy.
IMPRESSION: No mammographic evidence of malignancy. A result letter of this
screening mammogram will be mailed directly to the patient.

RECOMMENDATION:
Screening mammogram in one year. (Code:51-O-LD2)

BI-RADS CATEGORY  1: Negative.

## 2023-10-30 DIAGNOSIS — E7800 Pure hypercholesterolemia, unspecified: Secondary | ICD-10-CM | POA: Diagnosis not present

## 2023-10-30 DIAGNOSIS — E119 Type 2 diabetes mellitus without complications: Secondary | ICD-10-CM | POA: Diagnosis not present

## 2023-10-30 DIAGNOSIS — E039 Hypothyroidism, unspecified: Secondary | ICD-10-CM | POA: Diagnosis not present

## 2024-02-06 ENCOUNTER — Other Ambulatory Visit (HOSPITAL_BASED_OUTPATIENT_CLINIC_OR_DEPARTMENT_OTHER): Payer: Self-pay | Admitting: Family Medicine

## 2024-02-06 DIAGNOSIS — Z1231 Encounter for screening mammogram for malignant neoplasm of breast: Secondary | ICD-10-CM

## 2024-03-08 ENCOUNTER — Inpatient Hospital Stay (HOSPITAL_BASED_OUTPATIENT_CLINIC_OR_DEPARTMENT_OTHER)
Admission: RE | Admit: 2024-03-08 | Discharge: 2024-03-08 | Disposition: A | Payer: PRIVATE HEALTH INSURANCE | Source: Ambulatory Visit | Attending: Family Medicine | Admitting: Radiology

## 2024-03-08 ENCOUNTER — Encounter (HOSPITAL_BASED_OUTPATIENT_CLINIC_OR_DEPARTMENT_OTHER): Payer: Self-pay | Admitting: Radiology

## 2024-03-08 DIAGNOSIS — Z1231 Encounter for screening mammogram for malignant neoplasm of breast: Secondary | ICD-10-CM
